# Patient Record
Sex: Female | Born: 1970 | Race: White | Hispanic: No | Marital: Single | Smoking: Never smoker
Health system: Southern US, Community
[De-identification: ages and names within clinical notes are randomized; demographics above are authoritative.]

## PROBLEM LIST (undated history)

## (undated) HISTORY — PX: BREAST LUMPECTOMY: SHX2

---

## 1998-04-04 ENCOUNTER — Other Ambulatory Visit: Admission: RE | Admit: 1998-04-04 | Discharge: 1998-04-04 | Payer: Self-pay | Admitting: Obstetrics and Gynecology

## 1998-07-20 ENCOUNTER — Other Ambulatory Visit: Admission: RE | Admit: 1998-07-20 | Discharge: 1998-07-20 | Payer: Self-pay | Admitting: Obstetrics and Gynecology

## 1998-10-05 ENCOUNTER — Other Ambulatory Visit: Admission: RE | Admit: 1998-10-05 | Discharge: 1998-10-05 | Payer: Self-pay | Admitting: Obstetrics and Gynecology

## 1998-11-20 ENCOUNTER — Other Ambulatory Visit: Admission: RE | Admit: 1998-11-20 | Discharge: 1998-11-20 | Payer: Self-pay | Admitting: Obstetrics and Gynecology

## 2007-09-22 ENCOUNTER — Ambulatory Visit: Payer: Self-pay | Admitting: Internal Medicine

## 2010-01-15 ENCOUNTER — Ambulatory Visit: Payer: Self-pay | Admitting: Family Medicine

## 2010-01-17 ENCOUNTER — Emergency Department: Payer: Self-pay | Admitting: Emergency Medicine

## 2010-01-24 ENCOUNTER — Ambulatory Visit: Payer: Self-pay | Admitting: Family Medicine

## 2010-02-15 ENCOUNTER — Ambulatory Visit: Payer: Self-pay | Admitting: General Surgery

## 2010-02-19 ENCOUNTER — Ambulatory Visit: Payer: Self-pay | Admitting: General Surgery

## 2010-03-20 ENCOUNTER — Ambulatory Visit: Payer: Self-pay

## 2010-05-27 ENCOUNTER — Ambulatory Visit: Payer: Self-pay | Admitting: General Surgery

## 2011-02-05 ENCOUNTER — Ambulatory Visit: Payer: Self-pay | Admitting: General Surgery

## 2011-12-09 IMAGING — US ULTRASOUND LEFT BREAST
1 series · 17 of 23 positions shown · non-contrast
Comparison: none

REASON FOR EXAM: AV lt nodular density
COMMENTS:

PROCEDURE:     US  - US BREAST LEFT  - January 24, 2010 [DATE]
RESULT:       The region of interest within the left breast was evaluated
with from 12 o'clock to 3 o'clock position.
No solid or cystic sonographic abnormality is identified.

[Series 1: ultrasound left breast · 17 of 23 slices shown]
[im 1/23]
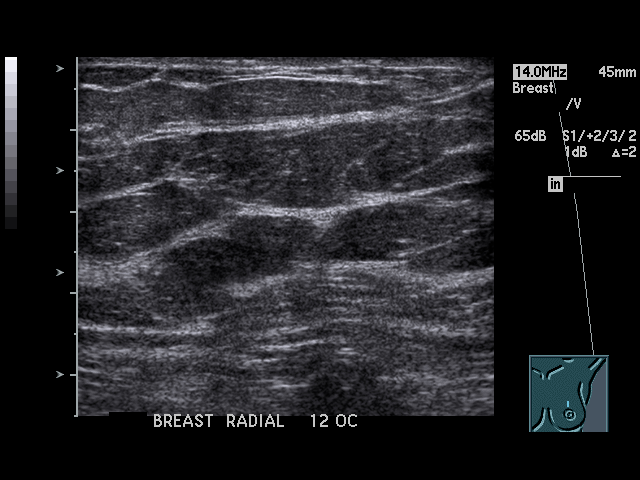
[im 3/23]
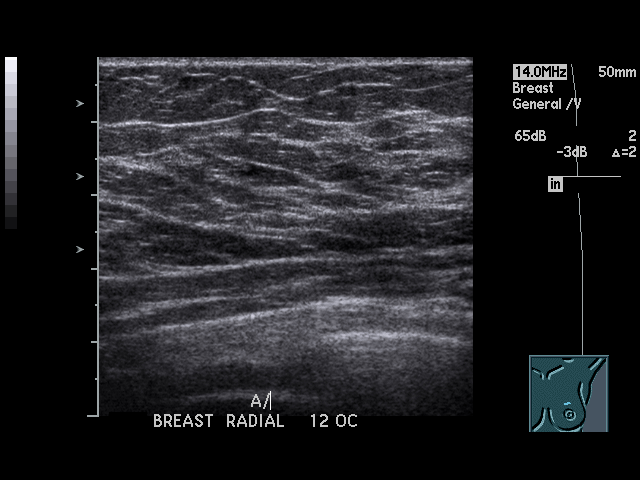
[im 4/23]
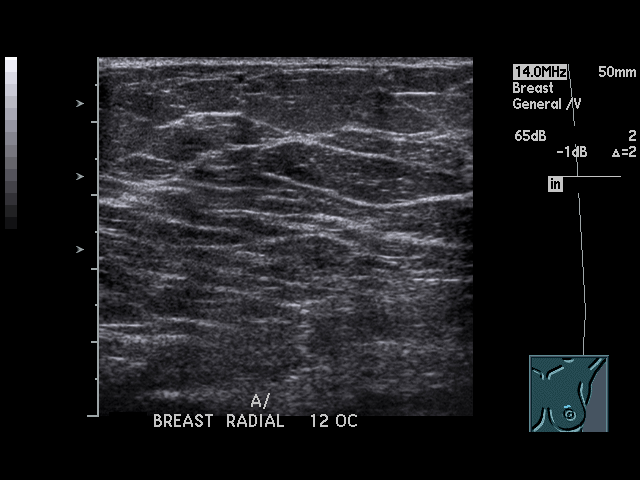
[im 5/23]
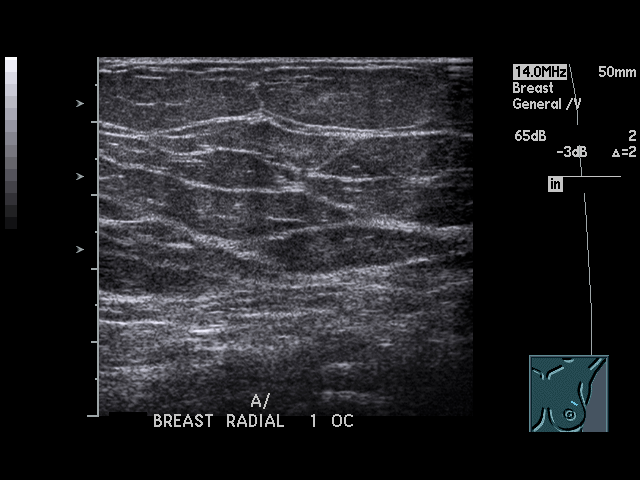
[im 7/23]
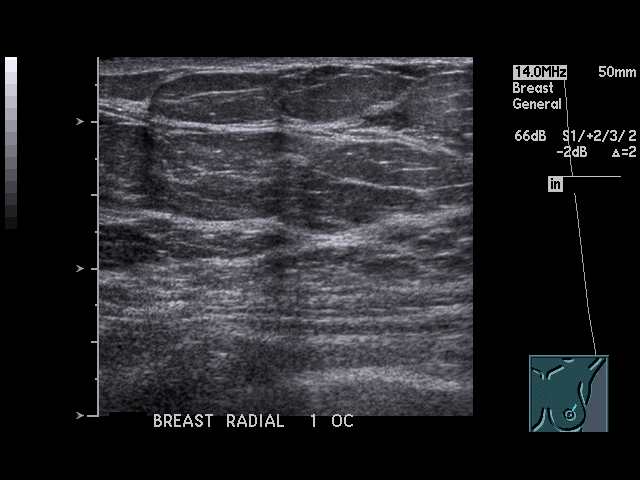
[im 8/23]
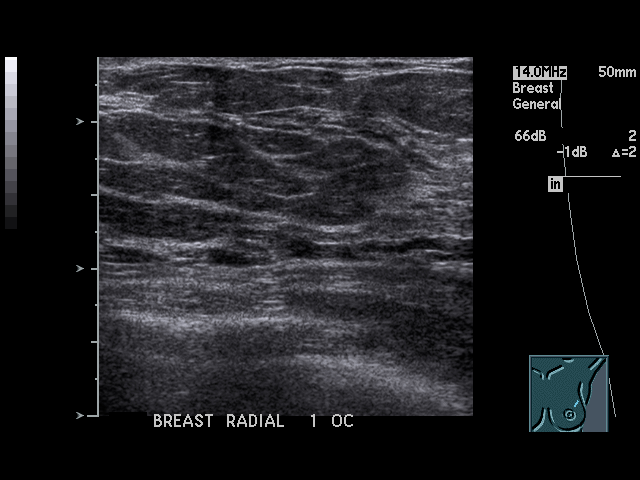
[im 9/23]
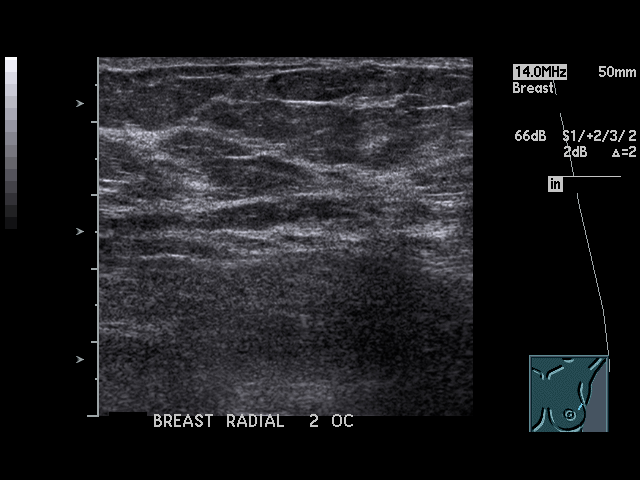
[im 11/23]
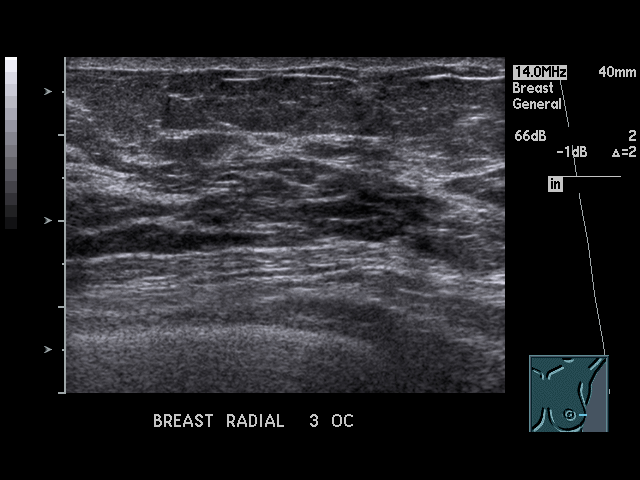
[im 12/23]
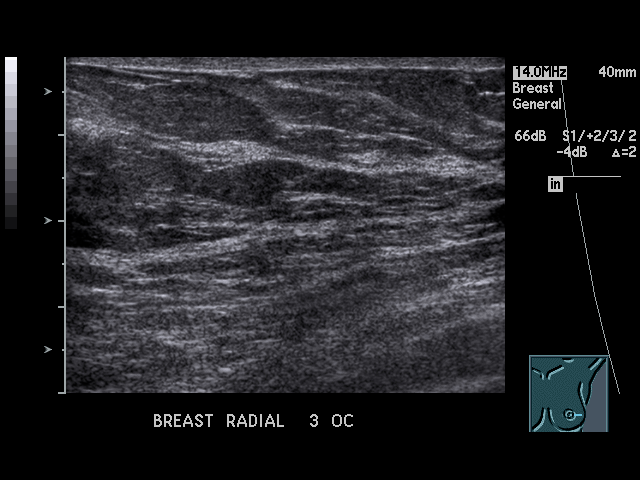
[im 13/23]
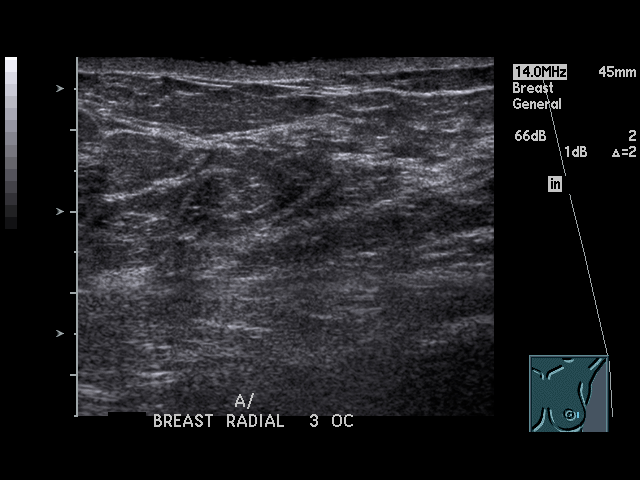
[im 15/23]
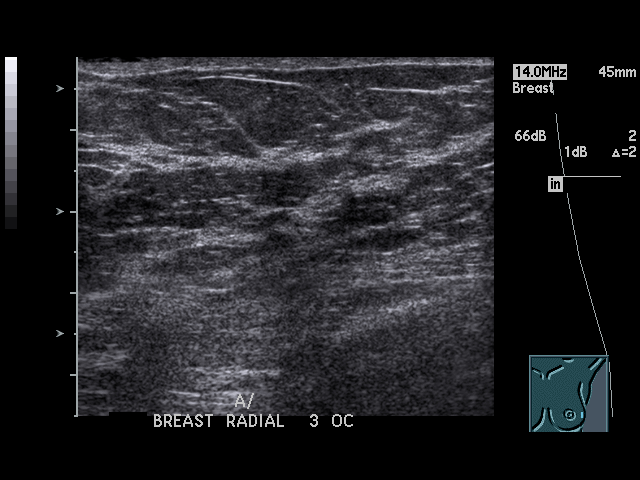
[im 16/23]
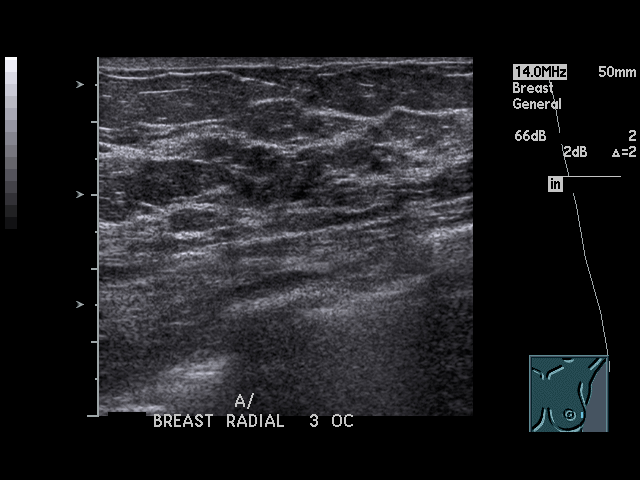
[im 17/23]
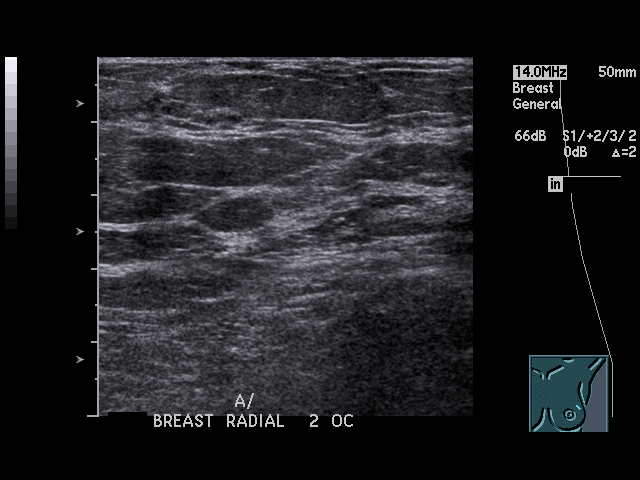
[im 19/23]
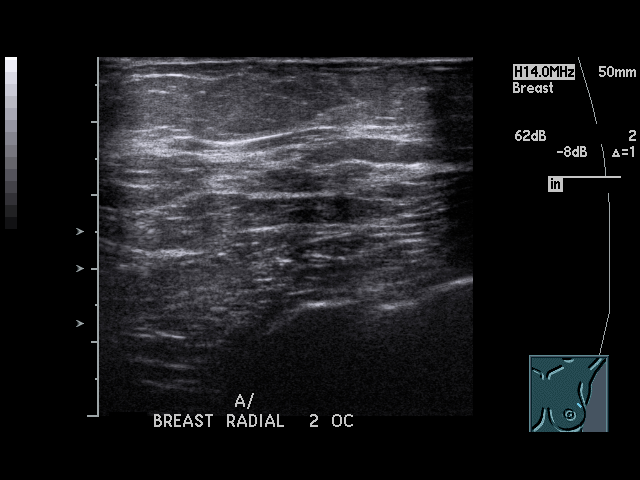
[im 20/23]
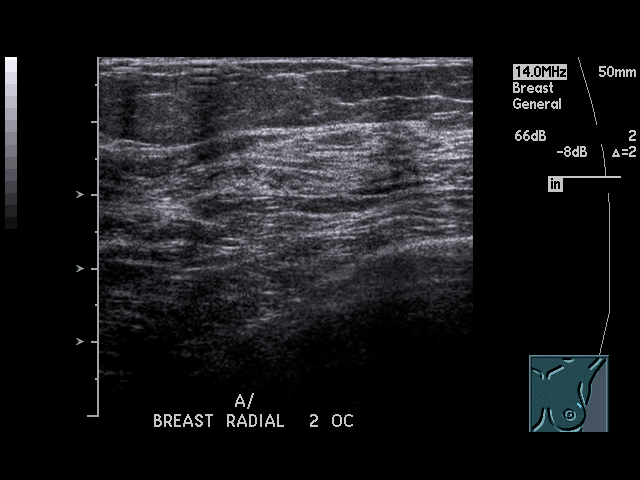
[im 21/23]
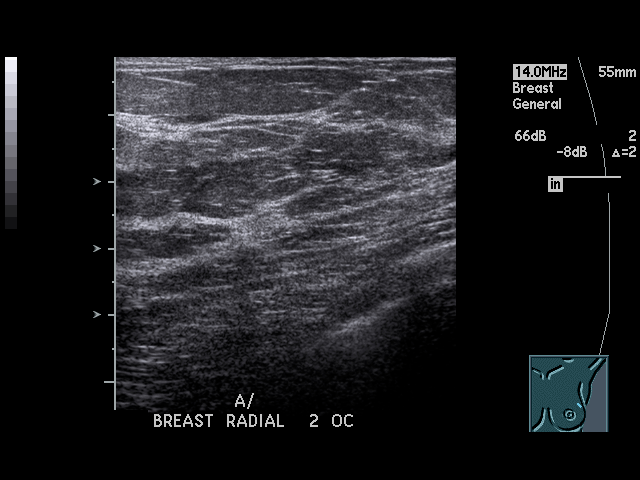
[im 23/23]
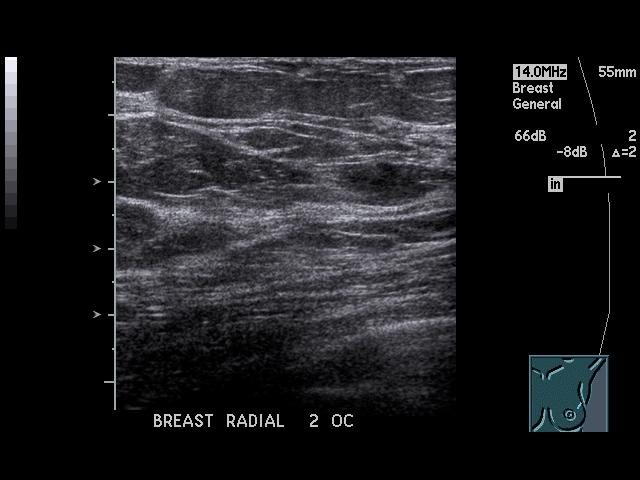

[17 of 23 positions shown; findings below may reference images not displayed]

IMPRESSION: Unremarkable focused left breast ultrasound. Please refer to the additional
mammographic view dictation for complete discussion.

## 2012-02-26 ENCOUNTER — Ambulatory Visit: Payer: Self-pay

## 2012-08-19 ENCOUNTER — Emergency Department: Payer: Self-pay | Admitting: Emergency Medicine

## 2012-08-19 LAB — COMPREHENSIVE METABOLIC PANEL
Alkaline Phosphatase: 84 U/L (ref 50–136)
Anion Gap: 8 (ref 7–16)
Bilirubin,Total: 0.2 mg/dL (ref 0.2–1.0)
Co2: 25 mmol/L (ref 21–32)
Creatinine: 0.83 mg/dL (ref 0.60–1.30)
EGFR (Non-African Amer.): >60
Glucose: 106 mg/dL — ABNORMAL HIGH (ref 65–99)
SGPT (ALT): 22 U/L (ref 12–78)
Sodium: 141 mmol/L (ref 136–145)
Total Protein: 7.2 g/dL (ref 6.4–8.2)

## 2012-08-19 LAB — URINALYSIS, COMPLETE
Bacteria: NONE SEEN
Blood: NEGATIVE
Ketone: NEGATIVE
Leukocyte Esterase: NEGATIVE
RBC,UR: 2 /HPF (ref 0–5)
WBC UR: 1 /HPF (ref 0–5)

## 2012-08-19 LAB — CBC WITH DIFFERENTIAL/PLATELET
HCT: 38.1 % (ref 35.0–47.0)
Lymphocyte #: 2.8 10*3/uL (ref 1.0–3.6)
MCH: 29.6 pg (ref 26.0–34.0)
MCV: 84 fL (ref 80–100)
Monocyte %: 9.3 %
Neutrophil #: 4 10*3/uL (ref 1.4–6.5)
Neutrophil %: 50.1 %
RBC: 4.56 10*6/uL (ref 3.80–5.20)

## 2012-08-19 LAB — LIPASE, BLOOD: Lipase: 150 U/L (ref 73–393)

## 2014-06-09 ENCOUNTER — Ambulatory Visit
Admit: 2014-06-09 | Disposition: A | Discharge status: Home / Self Care | Payer: Self-pay | Attending: Family Medicine | Admitting: Family Medicine

## 2014-07-04 IMAGING — CT CT ABD-PELV W/ CM
1 of 2 series · 15 of 32 positions shown, 19 images · non-contrast
Comparison: none

REASON FOR EXAM: (1) RLQ pain; (2) RLQ pain
COMMENTS:

[Series 2: 3mm soft tissue · axial · 0.77mm/px · z∈[-474,-36]mm · 15 of 160 slices shown, 19 images]
[im 7/160  soft-tissue]
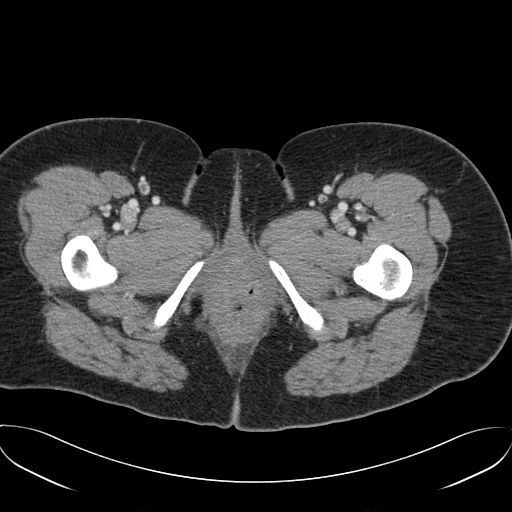
[im 7/160  bone]
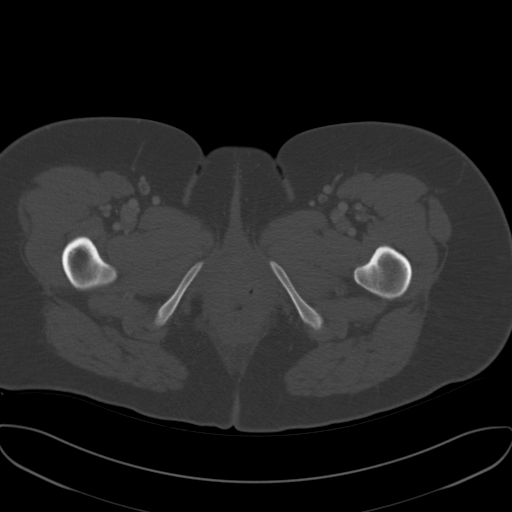
[im 21/160  soft-tissue]
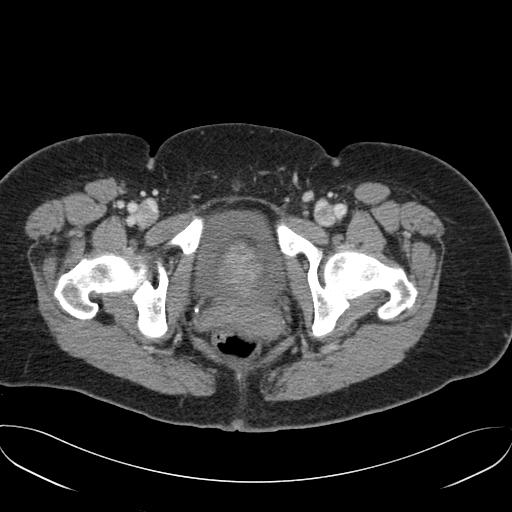
[im 35/160  soft-tissue]
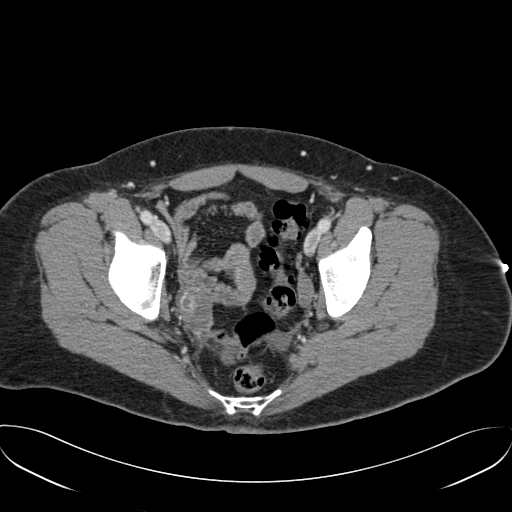
[im 42/160  soft-tissue]
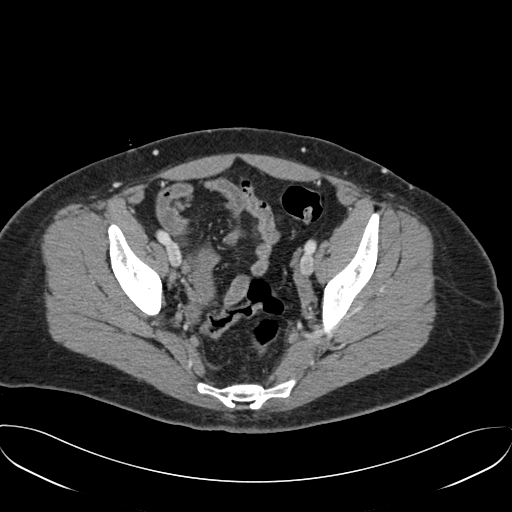
[im 56/160  soft-tissue]
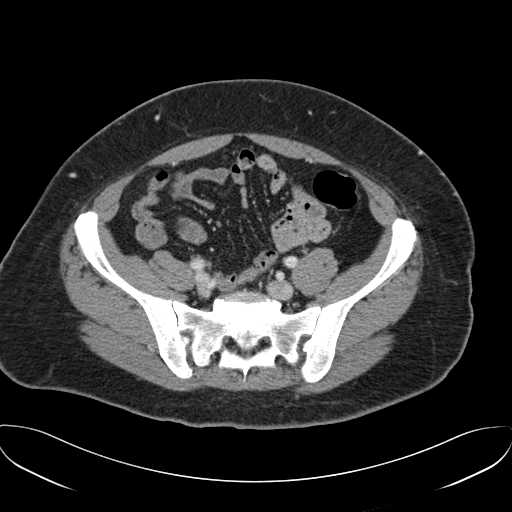
[im 70/160  soft-tissue]
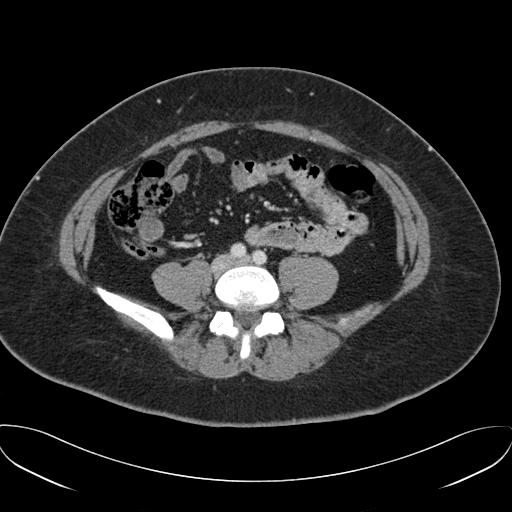
[im 83/160  soft-tissue]
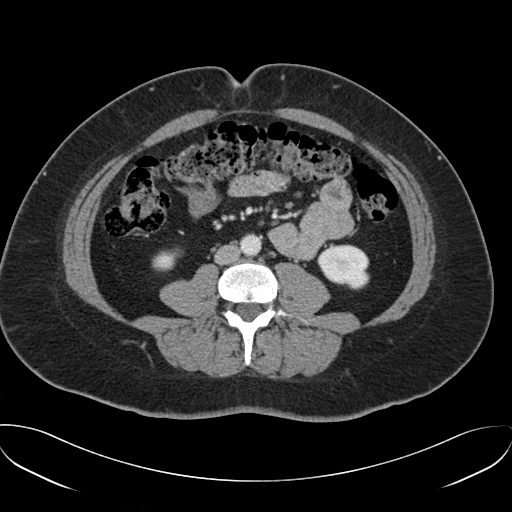
[im 90/160  soft-tissue]
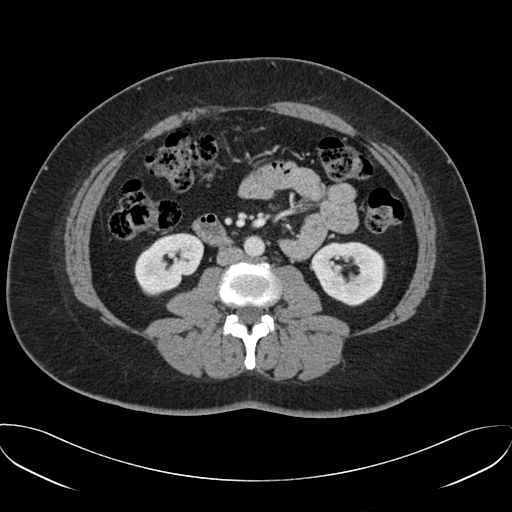
[im 104/160  soft-tissue]
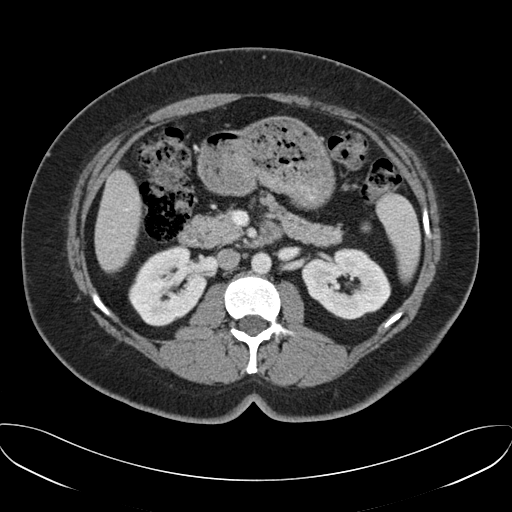
[im 104/160  bone]
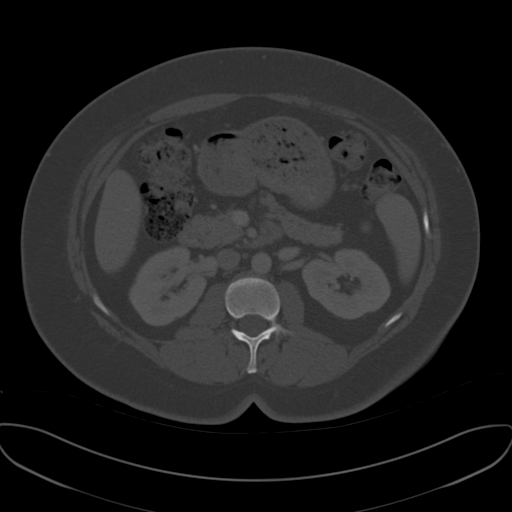
[im 118/160  soft-tissue]
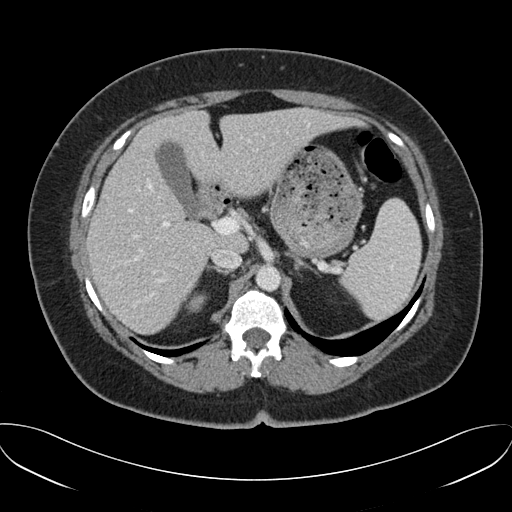
[im 125/160  soft-tissue]
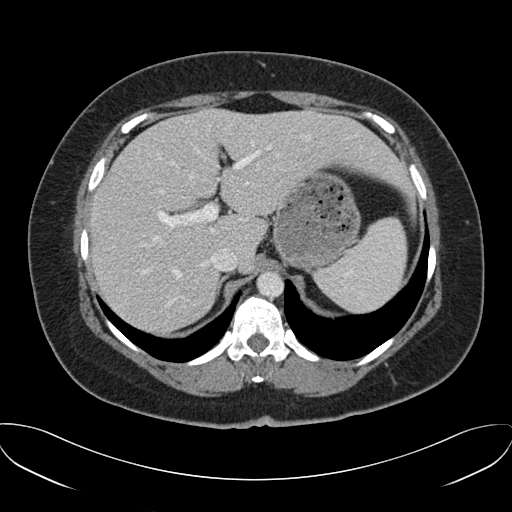
[im 132/160  lung]
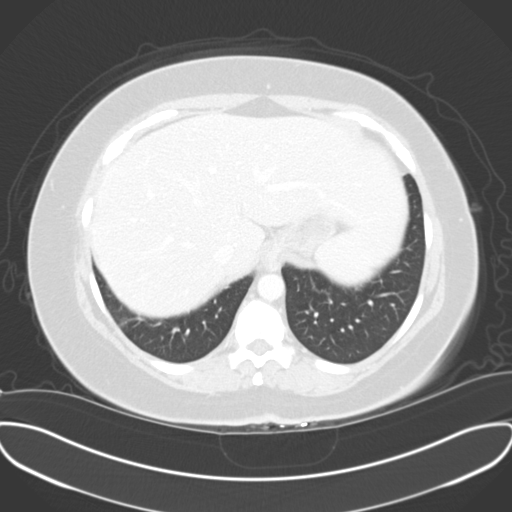
[im 139/160  soft-tissue]
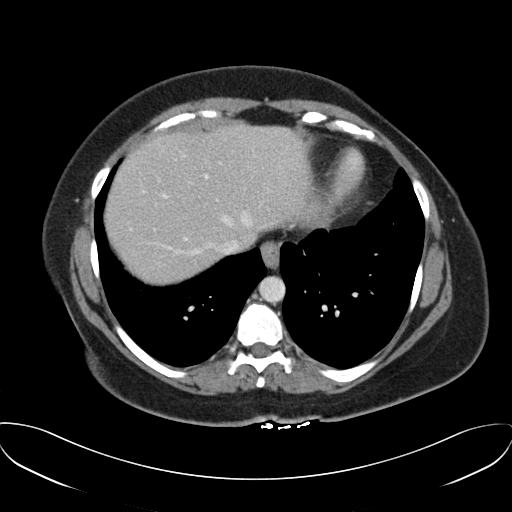
[im 139/160  lung]
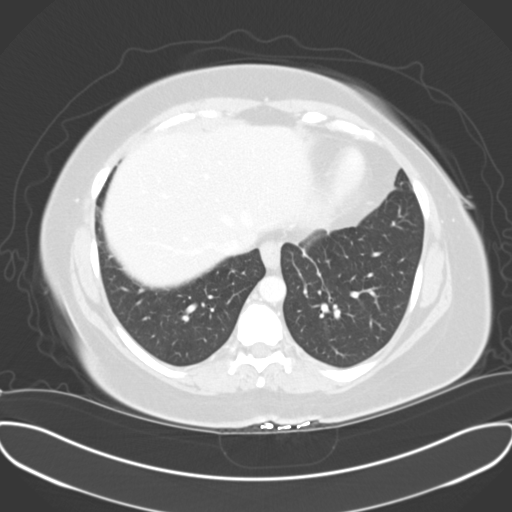
[im 146/160  lung]
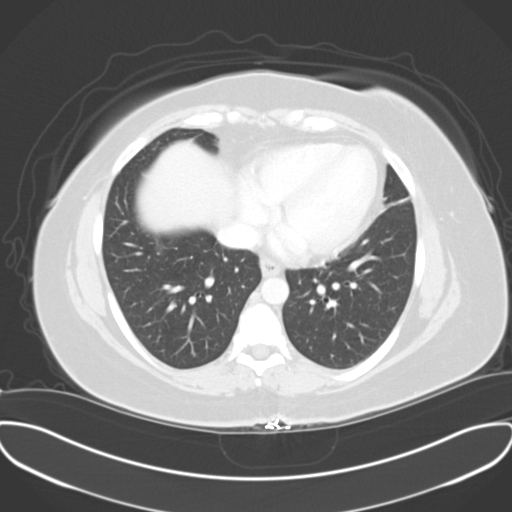
[im 153/160  soft-tissue]
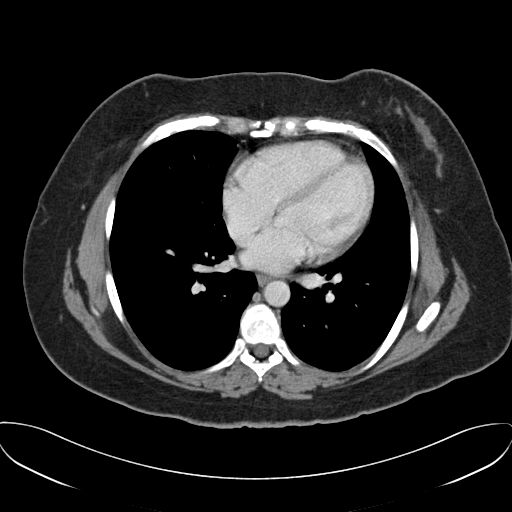
[im 153/160  lung]
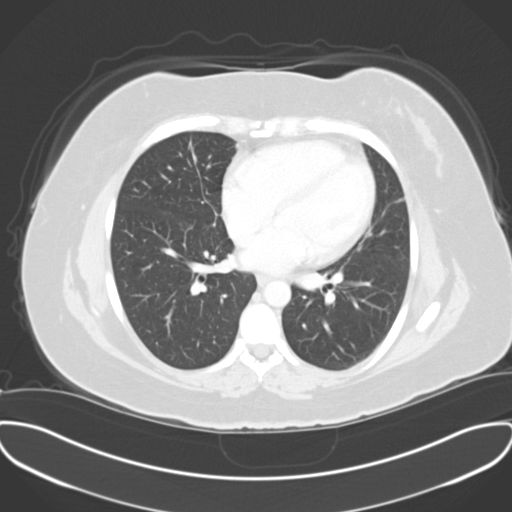

[15 of 32 positions shown; findings below may reference images not displayed]

PROCEDURE:     CT  - CT ABDOMEN / PELVIS  W  - August 19, 2012  [DATE]

RESULT:     CT of the abdomen and pelvis is performed with 100 mL of
Bsovue-QCY iodinated intravenous contrast. No oral contrast was utilized at
the request of the ordering physician. Images are reconstructed at 3 mm
slice thickness in the axial plane. There is no previous exam for comparison.

The lung bases appear clear. The liver is normal in size and attenuation.
The gallbladder shows no evidence of radiopaque stones. The pancreas,
kidneys and adrenal glands appear normal. The spleen is normal in size.
Accessory splenic tissue is seen adjacent to the pancreatic tail best
appreciated between images 53 and 57. No pancreatic mass or ductal dilation
is evident. A normal appearing appendix is present. There is no abnormal
bowel distention or bowel wall thickening evident. No ascites is evident.
The there may be assessed in the right ovary possibly partially collapsed
second cyst adjacent to it. There is no evidence of diverticulosis or acute
diverticulitis. There is a small fat filled umbilical hernia. The bony
structures appear unremarkable.
IMPRESSION: 1. Right ovarian cysts. Normal-appearing appendix. No adenopathy or acute
inflammatory process evident.

[REDACTED]

## 2017-11-22 ENCOUNTER — Encounter: Payer: Self-pay | Admitting: Gynecology

## 2017-11-22 ENCOUNTER — Other Ambulatory Visit: Payer: Self-pay

## 2017-11-22 ENCOUNTER — Ambulatory Visit
Admission: EM | Admit: 2017-11-22 | Discharge: 2017-11-22 | Disposition: A | Discharge status: Home / Self Care | Payer: Self-pay | Attending: Family Medicine | Admitting: Family Medicine

## 2017-11-22 DIAGNOSIS — H9201 Otalgia, right ear: Secondary | ICD-10-CM

## 2017-11-22 DIAGNOSIS — H60391 Other infective otitis externa, right ear: Secondary | ICD-10-CM

## 2017-11-22 MED ORDER — OFLOXACIN 0.3 % OT SOLN: 5.0000 [drp] | Freq: Two times a day (BID) | OTIC | 0 refills | Status: AC

## 2017-11-22 MED ORDER — AMOXICILLIN-POT CLAVULANATE 875-125 MG PO TABS: 1.0000 | ORAL_TABLET | Freq: Two times a day (BID) | ORAL | 0 refills | Status: AC

## 2017-11-22 NOTE — Discharge Instructions (Signed)
Recommend start Ofloxacin ear drops- place 4 drops in right ear twice a day as directed. Start Augmentin 875mg  twice a day for 7 days. Take Ibuprofen 600mg  every 6 hours as needed for pain. Follow-up in 2 to 3 days if not improving.

## 2017-11-22 NOTE — ED Triage Notes (Signed)
Patient c/o right ear pain x 5 days.  

## 2017-11-22 NOTE — ED Provider Notes (Signed)
[facility]    CSN: [id number] Arrival date & time: 11/22/17  0844     History   Chief Complaint Chief Complaint  Patient presents with  . Otalgia    HPI Melissa Mcgee is a 47 y.o. female.   48 year old female presents with right ear pain for the past 5 days. Thought she may have gotten some water in her ear and started putting OTC ear drops for swimmer's ear in her ear 4 days ago. Then used heat and ear candles to try to absorb any moisture and tried instilling sweet oil in her right ear with no relief. Now having a possible fever, feeling dizzy and more ear and sinus pressure. Also somewhat nauseous but denies any nasal congestion, coughing, or vomiting. No previous ear problems. No chronic health issues. Takes no daily medication.   The history is provided by the patient.    History reviewed. No pertinent past medical history.  There are no active problems to display for this patient.   Past Surgical History:  Procedure Laterality Date  . BREAST LUMPECTOMY Right     OB History   None      Home Medications    Prior to Admission medications   Medication Sig Start Date End Date Taking? Authorizing Provider  amoxicillin-clavulanate (AUGMENTIN) 875-125 MG tablet Take 1 tablet by mouth every 12 (twelve) hours for 7 days. 11/22/17 11/29/17  Sudie Grumbling, NP  ofloxacin (FLOXIN) 0.3 % OTIC solution Place 5 drops into the right ear 2 (two) times daily for 7 days. 11/22/17 11/29/17  Sudie Grumbling, NP    Family History Family History  Problem Relation Age of Onset  . Heart disease Mother   . Lung cancer Father     Social History Social History   Tobacco Use  . Smoking status: Never Smoker  . Smokeless tobacco: Never Used  Substance Use Topics  . Alcohol use: Never    Frequency: Never  . Drug use: Never     Allergies   Iodinated diagnostic agents and Pyridium [phenazopyridine hcl]   Review of Systems Review of Systems    Constitutional: Positive for fever (felt warm). Negative for activity change, appetite change, chills and fatigue.  HENT: Positive for ear pain (right), hearing loss (decreased hearing) and sinus pressure. Negative for congestion, ear discharge, facial swelling, mouth sores, nosebleeds, postnasal drip, rhinorrhea, sinus pain, sneezing, sore throat, tinnitus and trouble swallowing.   Eyes: Negative for pain, discharge, redness and itching.  Respiratory: Negative for cough, chest tightness, shortness of breath and wheezing.   Gastrointestinal: Positive for nausea. Negative for abdominal pain, diarrhea and vomiting.  Musculoskeletal: Negative for arthralgias, myalgias and neck pain.  Skin: Negative for color change, rash and wound.  Allergic/Immunologic: Negative for immunocompromised state.  Neurological: Positive for dizziness and light-headedness. Negative for tremors, seizures, syncope, speech difficulty, weakness, numbness and headaches.  Hematological: Negative for adenopathy. Does not bruise/bleed easily.     Physical Exam Triage Vital Signs ED Triage Vitals  Enc Vitals Group     BP 11/22/17 0855 121/81     Pulse Rate 11/22/17 0855 84     Resp 11/22/17 0855 18     Temp 11/22/17 0855 98.5 F (36.9 C)     Temp Source 11/22/17 0855 Oral     SpO2 11/22/17 0855 98 %     Weight 11/22/17 0854 206 lb (93.4 kg)     Height 11/22/17 0854 5\' 3"  (1.6 m)  Head Circumference --      Peak Flow --      Pain Score 11/22/17 0853 6     Pain Loc --      Pain Edu? --      Excl. in GC? --    No data found.  Updated Vital Signs BP 121/81 (BP Location: Left Arm)   Pulse 84   Temp 98.5 F (36.9 C) (Oral)   Resp 18   Ht 5\' 3"  (1.6 m)   Wt 206 lb (93.4 kg)   LMP 10/27/2017   SpO2 98%   BMI 36.49 kg/m   Visual Acuity Right Eye Distance:   Left Eye Distance:   Bilateral Distance:    Right Eye Near:   Left Eye Near:    Bilateral Near:     Physical Exam  Constitutional: She is  oriented to person, place, and time. Vital signs are normal. She appears well-developed and well-nourished. She is cooperative. She does not appear ill. No distress.  Patient sitting comfortably on exam table in no acute distress.   HENT:  Head: Normocephalic and atraumatic.  Right Ear: External ear normal. There is drainage (yellow green), swelling and tenderness. No foreign bodies. Decreased hearing is noted.  Left Ear: Hearing, tympanic membrane, external ear and ear canal normal.  Nose: Nose normal. Right sinus exhibits no maxillary sinus tenderness and no frontal sinus tenderness. Left sinus exhibits no maxillary sinus tenderness and no frontal sinus tenderness.  Mouth/Throat: Uvula is midline, oropharynx is clear and moist and mucous membranes are normal.  Unable to visualize right TM due to yellowish-green discharge and canal swelling.   Eyes: Conjunctivae and EOM are normal.  Neck: Normal range of motion. Neck supple.  Cardiovascular: Normal rate, regular rhythm and normal heart sounds.  No murmur heard. Pulmonary/Chest: Effort normal and breath sounds normal. No respiratory distress. She has no decreased breath sounds. She has no wheezes. She has no rhonchi.  Musculoskeletal: Normal range of motion.  Lymphadenopathy:    She has no cervical adenopathy.  Neurological: She is alert and oriented to person, place, and time.  Skin: Skin is warm and dry. No rash noted.  Psychiatric: She has a normal mood and affect. Her behavior is normal. Judgment and thought content normal.  Vitals reviewed.    UC Treatments / Results  Labs (all labs ordered are listed, but only abnormal results are displayed) Labs Reviewed - No data to display  EKG None  Radiology No results found.  Procedures Procedures (including critical care time)  Medications Ordered in UC Medications - No data to display  Initial Impression / Assessment and Plan / UC Course  I have reviewed the triage vital signs  and the nursing notes.  Pertinent labs & imaging results that were available during my care of the patient were reviewed by me and considered in my medical decision making (see chart for details).    Discussed with patient that she has otitis externa (swimmer's ear) of the right ear. Since unable to determine condition of TM due to discharge and swelling, will treat for otitis media as well. Patient does have insurance but did not have card with her today and uncertain about Rx coverage. Since Ciprodex is often not covered or extremely expensive and Cortisporin is toxic to inner ear if TM is ruptured, will trial Ofloxacin ear drops- 4 drops in right ear twice a day. Start oral Augmentin 875mg  twice a day as directed. May take OTC Ibuprofen 600mg  every  6 hours as needed for pain. Do not put any Q-tips or any other ear drops/solutions in ear. Follow-up in 2 to 3 days if not improving.  Final Clinical Impressions(s) / UC Diagnoses   Final diagnoses:  Acute ear pain, right  Infective otitis externa of right ear     Discharge Instructions     Recommend start Ofloxacin ear drops- place 4 drops in right ear twice a day as directed. Start Augmentin 875mg  twice a day for 7 days. Take Ibuprofen 600mg  every 6 hours as needed for pain. Follow-up in 2 to 3 days if not improving.     ED Prescriptions    Medication Sig Dispense Auth. Provider   amoxicillin-clavulanate (AUGMENTIN) 875-125 MG tablet Take 1 tablet by mouth every 12 (twelve) hours for 7 days. 14 tablet Sudie Grumbling, NP   ofloxacin (FLOXIN) 0.3 % OTIC solution Place 5 drops into the right ear 2 (two) times daily for 7 days. 5 mL Sudie Grumbling, NP     Controlled Substance Prescriptions [place] Controlled Substance Registry consulted? Not Applicable   Sudie Grumbling, NP 11/22/17 [phone]

## 2018-01-17 ENCOUNTER — Encounter: Payer: Self-pay | Admitting: Emergency Medicine

## 2018-01-17 ENCOUNTER — Other Ambulatory Visit: Payer: Self-pay

## 2018-01-17 ENCOUNTER — Ambulatory Visit
Admission: EM | Admit: 2018-01-17 | Discharge: 2018-01-17 | Disposition: A | Discharge status: Home / Self Care | Payer: Self-pay | Attending: Family Medicine | Admitting: Family Medicine

## 2018-01-17 DIAGNOSIS — Y92009 Unspecified place in unspecified non-institutional (private) residence as the place of occurrence of the external cause: Secondary | ICD-10-CM

## 2018-01-17 DIAGNOSIS — W2209XA Striking against other stationary object, initial encounter: Secondary | ICD-10-CM

## 2018-01-17 DIAGNOSIS — S99922A Unspecified injury of left foot, initial encounter: Secondary | ICD-10-CM

## 2018-01-17 MED ORDER — MELOXICAM 15 MG PO TABS: 15.0000 mg | ORAL_TABLET | Freq: Every day | ORAL | 0 refills | Status: AC | PRN

## 2018-01-17 NOTE — ED Provider Notes (Signed)
[facility]    CSN: [id number] Arrival date & time: 01/17/18  1021  History   Chief Complaint Chief Complaint  Patient presents with  . Toe Pain    left pinky    HPI  47 year old female presents with reports left fifth toe pain.  Patient states that she stubbed her toe on a column in a house approximately 9 days ago.  She injured her left fifth toe.  He states that he continues to be swollen and painful.  Decreased range of motion.  Has failed to improve despite conservative measures.  She is concerned about a fracture.  Worse with activity.  No relieving factors.  No other associated symptoms.  No other complaints.  Social Hx reviewed as below. Social History Social History   Tobacco Use  . Smoking status: Never Smoker  . Smokeless tobacco: Never Used  Substance Use Topics  . Alcohol use: Never    Frequency: Never  . Drug use: Never     Allergies   Iodinated diagnostic agents and Pyridium [phenazopyridine hcl]   Review of Systems Review of Systems  Constitutional: Negative.   Musculoskeletal:       Left 5th toe pain, swelling.   Physical Exam Triage Vital Signs ED Triage Vitals  Enc Vitals Group     BP 01/17/18 1107 121/81     Pulse Rate 01/17/18 1107 77     Resp 01/17/18 1107 18     Temp 01/17/18 1107 98.3 F (36.8 C)     Temp Source 01/17/18 1107 Oral     SpO2 01/17/18 1107 99 %     Weight 01/17/18 1102 185 lb (83.9 kg)     Height 01/17/18 1102 5\' 2"  (1.575 m)     Head Circumference --      Peak Flow --      Pain Score 01/17/18 1101 2     Pain Loc --      Pain Edu? --      Excl. in GC? --    Updated Vital Signs BP 121/81 (BP Location: Left Arm)   Pulse 77   Temp 98.3 F (36.8 C) (Oral)   Resp 18   Ht 5\' 2"  (1.575 m)   Wt 83.9 kg   LMP 01/15/2018   SpO2 99%   BMI 33.84 kg/m   Visual Acuity Right Eye Distance:   Left Eye Distance:   Bilateral Distance:    Right Eye Near:   Left Eye Near:    Bilateral Near:     Physical  Exam  Constitutional: She is oriented to person, place, and time. She appears well-developed. No distress.  HENT:  Head: Normocephalic and atraumatic.  Pulmonary/Chest: Effort normal. No respiratory distress.  Musculoskeletal:  Left fifth toe -swelling noted.  No crepitus.  Mild erythema.  Neurological: She is alert and oriented to person, place, and time.  Psychiatric: She has a normal mood and affect. Her behavior is normal.  Nursing note and vitals reviewed.  UC Treatments / Results  Labs (all labs ordered are listed, but only abnormal results are displayed) Labs Reviewed - No data to display  EKG None  Radiology No results found.  Procedures Procedures (including critical care time)  Medications Ordered in UC Medications - No data to display  Initial Impression / Assessment and Plan / UC Course  I have reviewed the triage vital signs and the nursing notes.  Pertinent labs & imaging results that were available during my care of the patient  were reviewed by me and considered in my medical decision making (see chart for details).    47 year old female presents with left fifth toe pain.  We discussed potential imaging and patient declined.  I really do not feel that imaging is necessary due to the fact that if she has a fracture there is not much we can do other than to buddy tape the area and let it heal.  Patient is okay with avoiding x-ray.  Treating swelling and pain with meloxicam.  Supportive care.  Final Clinical Impressions(s) / UC Diagnoses   Final diagnoses:  Injury of toe on left foot, initial encounter     Discharge Instructions     Rest, ice, elevate.  Use the medication as directed.  Take care  Dr. Adriana Simas    ED Prescriptions    Medication Sig Dispense Auth. Provider   meloxicam (MOBIC) 15 MG tablet Take 1 tablet (15 mg total) by mouth daily as needed. 30 tablet Tommie Sams, DO     Controlled Substance Prescriptions [place] Controlled Substance  Registry consulted? Not Applicable   Tommie Sams, DO 01/17/18 1652

## 2018-01-17 NOTE — ED Triage Notes (Signed)
Pt c/o left pinky toe pain. She stumped it on column beam in a house about 9 days ago. She reports that it is not getting better. It is swollen and she can not move it.

## 2018-01-17 NOTE — Discharge Instructions (Signed)
Rest, ice, elevate.  Use the medication as directed.  Take care  Dr. Adriana Simasook

## 2021-10-03 ENCOUNTER — Encounter: Payer: Self-pay | Admitting: Emergency Medicine

## 2021-10-03 ENCOUNTER — Ambulatory Visit
Admission: EM | Admit: 2021-10-03 | Discharge: 2021-10-03 | Disposition: A | Discharge status: Home / Self Care | Payer: BC Managed Care – PPO | Attending: Family Medicine | Admitting: Family Medicine

## 2021-10-03 DIAGNOSIS — J069 Acute upper respiratory infection, unspecified: Secondary | ICD-10-CM | POA: Insufficient documentation

## 2021-10-03 DIAGNOSIS — Z20822 Contact with and (suspected) exposure to covid-19: Secondary | ICD-10-CM | POA: Diagnosis not present

## 2021-10-03 LAB — GROUP A STREP BY PCR: Group A Strep by PCR: NOT DETECTED

## 2021-10-03 LAB — SARS CORONAVIRUS 2 BY RT PCR: SARS Coronavirus 2 by RT PCR: NEGATIVE

## 2021-10-03 NOTE — ED Provider Notes (Signed)
[facility]    CSN: [id number] Arrival date & time: 10/03/21  1516      History   Chief Complaint Chief Complaint  Patient presents with   Sore Throat   Otalgia    HPI Melissa Mcgee is a 51 y.o. female.   HPI   Melissa Mcgee presents for 2 days of sore throat with associated right neck discomfort.  She feels like she has strep throat.  No known exposure to strep.  She has taken Echinacea and elderberry without relief.  Says that she does not like taking medications but her throat was hurting so bad she came into the clinic.  She had some right-sided ear pain today.  Denies recent swimming.  She felt hot but has not taken her temperature.  She has had somewhat dry cough.  She has some nasal congestion.  Has not yet taken a COVID test.  Fever : not documented Chills: no Sore throat: Yes Cough: Yes Sputum: no Nasal congestion : Yes Rhinorrhea: no Myalgias: no Appetite: normal  Hydration: normal  Abdominal pain: no Nausea: no Vomiting: no Headache: no      History reviewed. No pertinent past medical history.  There are no problems to display for this patient.   Past Surgical History:  Procedure Laterality Date   BREAST LUMPECTOMY Right     OB History   No obstetric history on file.      Home Medications    Prior to Admission medications   Medication Sig Start Date End Date Taking? Authorizing Provider  meloxicam (MOBIC) 15 MG tablet Take 1 tablet (15 mg total) by mouth daily as needed. 01/17/18   Tommie Sams, DO    Family History Family History  Problem Relation Age of Onset   Heart disease Mother    Lung cancer Father     Social History Social History   Tobacco Use   Smoking status: Never   Smokeless tobacco: Never  Vaping Use   Vaping Use: Never used  Substance Use Topics   Alcohol use: Never   Drug use: Never     Allergies   Iodinated contrast media and Pyridium [phenazopyridine hcl]   Review of Systems Review of  Systems: :negative unless otherwise stated in HPI.      Physical Exam Triage Vital Signs ED Triage Vitals [10/03/21 1529]  Enc Vitals Group     BP 132/81     Pulse Rate 81     Resp 18     Temp 98.2 F (36.8 C)     Temp Source Oral     SpO2 100 %     Weight      Height      Head Circumference      Peak Flow      Pain Score 2     Pain Loc      Pain Edu?      Excl. in GC?    No data found.  Updated Vital Signs BP 132/81 (BP Location: Left Arm)   Pulse 81   Temp 98.2 F (36.8 C) (Oral)   Resp 18   SpO2 100%   Visual Acuity Right Eye Distance:   Left Eye Distance:   Bilateral Distance:    Right Eye Near:   Left Eye Near:    Bilateral Near:     Physical Exam GEN:     alert, non-toxic appearing female in no distress    HENT:  mucus membranes moist, oropharyngeal erythema, no  exudate, no tonsillar hypertrophy, moderate erythematous turbinate hypertrophy, yellow-green nasal discharge, bilateral TM normal EYES:   pupils equal and reactive, no scleral injection NECK:  normal ROM, no lymphadenopathy, no meningismus   RESP:  no increased work of breathing  CVS:   regular rate Skin:   warm and dry    UC Treatments / Results  Labs (all labs ordered are listed, but only abnormal results are displayed) Labs Reviewed  GROUP A STREP BY PCR  SARS CORONAVIRUS 2 BY RT PCR    EKG   Radiology No results found.  Procedures Procedures (including critical care time)  Medications Ordered in UC Medications - No data to display  Initial Impression / Assessment and Plan / UC Course  I have reviewed the triage vital signs and the nursing notes.  Pertinent labs & imaging results that were available during my care of the patient were reviewed by me and considered in my medical decision making (see chart for details).     History consistent with viral upper respiratory illness. Overall pt is well appearing, well hydrated, without respiratory distress. Discussed  symptomatic treatment.  Strep test was negative.  COVID testing obtained and was negative. Explained lack of efficacy of antibiotics in viral disease. - continue Tylenol/ Motrin as needed for discomfort/fever - nasal saline to help with his nasal congestion - Use humidifier at bedtime to help with breathing - Stressed hydration - Discussed return and ED precautions, understanding voiced   Final Clinical Impressions(s) / UC Diagnoses   Final diagnoses:  Viral upper respiratory tract infection     Discharge Instructions      We will contact you if your COVID test is positive.  Please quarantine while you wait for the results.  If your test is negative you may resume normal activities.  If your test is positive please continue to quarantine for at least 5 days from your symptom onset or until you are without a fever for at least 24 hours after the medications.   You can take Tylenol and/or Ibuprofen as needed for fever reduction and pain relief.    For cough: honey 1/2 to 1 teaspoon (you can dilute the honey in water or another fluid).  You can also use guaifenesin and dextromethorphan for cough. You can use a humidifier for chest congestion and cough.  If you don't have a humidifier, you can sit in the bathroom with the hot shower running.      For sore throat: try warm salt water gargles, cepacol lozenges, throat spray, warm tea or water with lemon/honey, popsicles or ice, or OTC cold relief medicine for throat discomfort.    For congestion: take a daily anti-histamine like Zyrtec, Claritin, and a oral decongestant, such as pseudoephedrine.  You can also use Flonase 1-2 sprays in each nostril daily.    It is important to stay hydrated: drink plenty of fluids (water, gatorade/powerade/pedialyte, juices, or teas) to keep your throat moisturized and help further relieve irritation/discomfort.    Return or go to the Emergency Department if symptoms worsen or do not improve in the next few  days      ED Prescriptions   None    PDMP not reviewed this encounter.   Katha Cabal, DO 10/04/21 1455

## 2021-10-03 NOTE — Discharge Instructions (Signed)

## 2021-10-03 NOTE — ED Triage Notes (Addendum)
Pt reports sore throat x 2 days. States she thinks she has strep throat. Denies taking any OTC medication.  Also endorses right ear pain beginning this morning.
